# Patient Record
Sex: Female | Born: 1960 | Race: Black or African American | Hispanic: No | Marital: Married | Smoking: Never smoker
Health system: Southern US, Community
[De-identification: ages and names within clinical notes are randomized; demographics above are authoritative.]

## PROBLEM LIST (undated history)

## (undated) ENCOUNTER — Emergency Department (HOSPITAL_COMMUNITY)

## (undated) HISTORY — PX: ABDOMINAL HYSTERECTOMY: SHX81

---

## 1997-11-06 ENCOUNTER — Other Ambulatory Visit: Admission: RE | Admit: 1997-11-06 | Discharge: 1997-11-06 | Payer: Self-pay | Admitting: Gynecology

## 1998-09-20 ENCOUNTER — Ambulatory Visit (HOSPITAL_COMMUNITY): Admission: RE | Admit: 1998-09-20 | Discharge: 1998-09-20 | Payer: Self-pay | Admitting: Gynecology

## 1999-09-02 ENCOUNTER — Other Ambulatory Visit: Admission: RE | Admit: 1999-09-02 | Discharge: 1999-09-02 | Payer: Self-pay | Admitting: Gynecology

## 1999-10-06 ENCOUNTER — Other Ambulatory Visit: Admission: RE | Admit: 1999-10-06 | Discharge: 1999-10-06 | Payer: Self-pay | Admitting: Gynecology

## 1999-10-24 ENCOUNTER — Ambulatory Visit (HOSPITAL_COMMUNITY): Admission: RE | Admit: 1999-10-24 | Discharge: 1999-10-24 | Payer: Self-pay | Admitting: Gynecology

## 1999-11-14 ENCOUNTER — Ambulatory Visit (HOSPITAL_COMMUNITY): Admission: RE | Admit: 1999-11-14 | Discharge: 1999-11-14 | Payer: Self-pay | Admitting: Gynecology

## 2000-02-26 ENCOUNTER — Other Ambulatory Visit: Admission: RE | Admit: 2000-02-26 | Discharge: 2000-02-26 | Payer: Self-pay | Admitting: Gynecology

## 2000-08-17 ENCOUNTER — Other Ambulatory Visit: Admission: RE | Admit: 2000-08-17 | Discharge: 2000-08-17 | Payer: Self-pay | Admitting: Gynecology

## 2001-08-17 ENCOUNTER — Other Ambulatory Visit: Admission: RE | Admit: 2001-08-17 | Discharge: 2001-08-17 | Payer: Self-pay | Admitting: Obstetrics & Gynecology

## 2003-06-11 ENCOUNTER — Other Ambulatory Visit: Admission: RE | Admit: 2003-06-11 | Discharge: 2003-06-11 | Payer: Self-pay | Admitting: Gynecology

## 2004-08-12 ENCOUNTER — Inpatient Hospital Stay (HOSPITAL_COMMUNITY): Admission: RE | Admit: 2004-08-12 | Discharge: 2004-08-14 | Payer: Self-pay | Admitting: Gynecology

## 2010-11-10 ENCOUNTER — Other Ambulatory Visit: Payer: Self-pay | Admitting: Plastic Surgery

## 2010-11-11 ENCOUNTER — Other Ambulatory Visit (HOSPITAL_COMMUNITY): Payer: Self-pay | Admitting: Plastic Surgery

## 2010-11-11 DIAGNOSIS — S301XXA Contusion of abdominal wall, initial encounter: Secondary | ICD-10-CM

## 2010-11-19 ENCOUNTER — Ambulatory Visit (HOSPITAL_COMMUNITY)
Admission: RE | Admit: 2010-11-19 | Discharge: 2010-11-19 | Disposition: A | Discharge status: Home / Self Care | Payer: Managed Care, Other (non HMO) | Source: Ambulatory Visit | Attending: Plastic Surgery | Admitting: Plastic Surgery

## 2010-11-19 DIAGNOSIS — S301XXA Contusion of abdominal wall, initial encounter: Secondary | ICD-10-CM

## 2010-11-19 DIAGNOSIS — Y838 Other surgical procedures as the cause of abnormal reaction of the patient, or of later complication, without mention of misadventure at the time of the procedure: Secondary | ICD-10-CM | POA: Insufficient documentation

## 2010-11-19 DIAGNOSIS — IMO0002 Reserved for concepts with insufficient information to code with codable children: Secondary | ICD-10-CM | POA: Insufficient documentation

## 2010-11-19 DIAGNOSIS — Z01812 Encounter for preprocedural laboratory examination: Secondary | ICD-10-CM | POA: Insufficient documentation

## 2010-11-19 LAB — CBC
Platelets: 212 10*3/uL (ref 150–400)
RDW: 13.1 % (ref 11.5–15.5)
WBC: 4.2 10*3/uL (ref 4.0–10.5)

## 2010-11-19 LAB — PROTIME-INR: INR: 1.04 (ref 0.00–1.49)

## 2010-11-19 LAB — APTT: aPTT: 32 seconds (ref 24–37)

## 2010-11-20 ENCOUNTER — Other Ambulatory Visit: Payer: Self-pay | Admitting: Interventional Radiology

## 2010-11-20 DIAGNOSIS — IMO0002 Reserved for concepts with insufficient information to code with codable children: Secondary | ICD-10-CM

## 2010-11-26 ENCOUNTER — Other Ambulatory Visit: Payer: Self-pay | Admitting: Interventional Radiology

## 2010-11-26 ENCOUNTER — Ambulatory Visit
Admission: RE | Admit: 2010-11-26 | Discharge: 2010-11-26 | Disposition: A | Discharge status: Home / Self Care | Payer: Managed Care, Other (non HMO) | Source: Ambulatory Visit | Attending: Interventional Radiology | Admitting: Interventional Radiology

## 2010-11-26 DIAGNOSIS — IMO0002 Reserved for concepts with insufficient information to code with codable children: Secondary | ICD-10-CM

## 2010-12-02 ENCOUNTER — Inpatient Hospital Stay: Admission: RE | Admit: 2010-12-02 | Payer: Managed Care, Other (non HMO) | Source: Ambulatory Visit

## 2012-04-11 ENCOUNTER — Other Ambulatory Visit: Payer: Self-pay | Admitting: Gynecology

## 2015-01-28 ENCOUNTER — Ambulatory Visit (INDEPENDENT_AMBULATORY_CARE_PROVIDER_SITE_OTHER): Payer: Managed Care, Other (non HMO)

## 2015-01-28 ENCOUNTER — Ambulatory Visit (INDEPENDENT_AMBULATORY_CARE_PROVIDER_SITE_OTHER): Payer: Managed Care, Other (non HMO) | Admitting: Family Medicine

## 2015-01-28 VITALS — BP 130/83 | HR 114 | Temp 100.7°F | Resp 18 | Ht 68.0 in | Wt 201.8 lb

## 2015-01-28 DIAGNOSIS — J9801 Acute bronchospasm: Secondary | ICD-10-CM | POA: Diagnosis not present

## 2015-01-28 DIAGNOSIS — R05 Cough: Secondary | ICD-10-CM

## 2015-01-28 DIAGNOSIS — J988 Other specified respiratory disorders: Secondary | ICD-10-CM | POA: Diagnosis not present

## 2015-01-28 DIAGNOSIS — R059 Cough, unspecified: Secondary | ICD-10-CM

## 2015-01-28 DIAGNOSIS — R509 Fever, unspecified: Secondary | ICD-10-CM

## 2015-01-28 DIAGNOSIS — J22 Unspecified acute lower respiratory infection: Secondary | ICD-10-CM

## 2015-01-28 LAB — POCT CBC
GRANULOCYTE PERCENT: 61.8 % (ref 37–80)
HEMATOCRIT: 37.6 % — AB (ref 37.7–47.9)
HEMOGLOBIN: 12.1 g/dL — AB (ref 12.2–16.2)
Lymph, poc: 1.7 (ref 0.6–3.4)
MCH, POC: 26.3 pg — AB (ref 27–31.2)
MCHC: 32.2 g/dL (ref 31.8–35.4)
MCV: 81.8 fL (ref 80–97)
MID (cbc): 0.5 (ref 0–0.9)
MPV: 7.8 fL (ref 0–99.8)
PLATELET COUNT, POC: 205 10*3/uL (ref 142–424)
POC Granulocyte: 3.5 (ref 2–6.9)
POC LYMPH %: 29.7 % (ref 10–50)
POC MID %: 8.5 % (ref 0–12)
RBC: 4.6 M/uL (ref 4.04–5.48)
RDW, POC: 14.1 %
WBC: 5.7 10*3/uL (ref 4.6–10.2)

## 2015-01-28 MED ORDER — AZITHROMYCIN 250 MG PO TABS: ORAL_TABLET | ORAL | Status: AC

## 2015-01-28 MED ORDER — BENZONATATE 100 MG PO CAPS: 100.0000 mg | ORAL_CAPSULE | Freq: Three times a day (TID) | ORAL | Status: AC | PRN

## 2015-01-28 MED ORDER — IPRATROPIUM BROMIDE 0.03 % NA SOLN: 2.0000 | Freq: Two times a day (BID) | NASAL | Status: AC

## 2015-01-28 MED ORDER — ALBUTEROL SULFATE (2.5 MG/3ML) 0.083% IN NEBU
2.5000 mg | INHALATION_SOLUTION | Freq: Once | RESPIRATORY_TRACT | Status: AC
Start: 2015-01-28 — End: 2015-01-28
  Administered 2015-01-28: 2.5 mg via RESPIRATORY_TRACT

## 2015-01-28 NOTE — Patient Instructions (Signed)
1.  Continue Mucinex twice daily.  Sinusitis Sinusitis is redness, soreness, and inflammation of the paranasal sinuses. Paranasal sinuses are air pockets within the bones of your face (beneath the eyes, the middle of the forehead, or above the eyes). In healthy paranasal sinuses, mucus is able to drain out, and air is able to circulate through them by way of your nose. However, when your paranasal sinuses are inflamed, mucus and air can become trapped. This can allow bacteria and other germs to grow and cause infection. Sinusitis can develop quickly and last only a short time (acute) or continue over a long period (chronic). Sinusitis that lasts for more than 12 weeks is considered chronic.  CAUSES  Causes of sinusitis include:  Allergies.  Structural abnormalities, such as displacement of the cartilage that separates your nostrils (deviated septum), which can decrease the air flow through your nose and sinuses and affect sinus drainage.  Functional abnormalities, such as when the small hairs (cilia) that line your sinuses and help remove mucus do not work properly or are not present. SIGNS AND SYMPTOMS  Symptoms of acute and chronic sinusitis are the same. The primary symptoms are pain and pressure around the affected sinuses. Other symptoms include:  Upper toothache.  Earache.  Headache.  Bad breath.  Decreased sense of smell and taste.  A cough, which worsens when you are lying flat.  Fatigue.  Fever.  Thick drainage from your nose, which often is green and may contain pus (purulent).  Swelling and warmth over the affected sinuses. DIAGNOSIS  Your health care provider will perform a physical exam. During the exam, your health care provider may:  Look in your nose for signs of abnormal growths in your nostrils (nasal polyps).  Tap over the affected sinus to check for signs of infection.  View the inside of your sinuses (endoscopy) using an imaging device that has a light  attached (endoscope). If your health care provider suspects that you have chronic sinusitis, one or more of the following tests may be recommended:  Allergy tests.  Nasal culture. A sample of mucus is taken from your nose, sent to a lab, and screened for bacteria.  Nasal cytology. A sample of mucus is taken from your nose and examined by your health care provider to determine if your sinusitis is related to an allergy. TREATMENT  Most cases of acute sinusitis are related to a viral infection and will resolve on their own within 10 days. Sometimes medicines are prescribed to help relieve symptoms (pain medicine, decongestants, nasal steroid sprays, or saline sprays).  However, for sinusitis related to a bacterial infection, your health care provider will prescribe antibiotic medicines. These are medicines that will help kill the bacteria causing the infection.  Rarely, sinusitis is caused by a fungal infection. In theses cases, your health care provider will prescribe antifungal medicine. For some cases of chronic sinusitis, surgery is needed. Generally, these are cases in which sinusitis recurs more than 3 times per year, despite other treatments. HOME CARE INSTRUCTIONS   Drink plenty of water. Water helps thin the mucus so your sinuses can drain more easily.  Use a humidifier.  Inhale steam 3 to 4 times a day (for example, sit in the bathroom with the shower running).  Apply a warm, moist washcloth to your face 3 to 4 times a day, or as directed by your health care provider.  Use saline nasal sprays to help moisten and clean your sinuses.  Take medicines only as  directed by your health care provider.  If you were prescribed either an antibiotic or antifungal medicine, finish it all even if you start to feel better. SEEK IMMEDIATE MEDICAL CARE IF:  You have increasing pain or severe headaches.  You have nausea, vomiting, or drowsiness.  You have swelling around your face.  You  have vision problems.  You have a stiff neck.  You have difficulty breathing. MAKE SURE YOU:   Understand these instructions.  Will watch your condition.  Will get help right away if you are not doing well or get worse. Document Released: 06/22/2005 Document Revised: 11/06/2013 Document Reviewed: 07/07/2011 [facility] Patient Information 2015 [place], [place]. This information is not intended to replace advice given to you by your health care provider. Make sure you discuss any questions you have with your health care provider.

## 2015-01-28 NOTE — Progress Notes (Signed)
Subjective:    Patient ID: Holly Li, female    DOB: 1961/03/29, 54 y.o.   MRN: [id number]  01/28/2015  Cough; Chest Congestion; and Fever   HPI This 54 y.o. female presents for evaluation of acute illness four days ago.  +chills five days ago.  +cough four days ago.  Started taking Mucinex this morning.  Might have helped chest congestion.  +fever Tmax 101.9; no sweats. +HA.  No ear pain.  +ST; diffuse sore throat; no pain with swallowing. +nasal congestion.  +rhinorrhea.  Yellow thin mucous.  No sputum production.  +wheezing.  No asthma.  No tobacco.  +SOB; +excessive fatigue. 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    PCP: O-Buck; [facility]    Review of Systems  Constitutional: Positive for fever, chills and fatigue. Negative for diaphoresis.  HENT: Positive for congestion, postnasal drip, rhinorrhea, sore throat and voice change. Negative for ear pain, sinus pressure and trouble swallowing.   Respiratory: Positive for cough, shortness of breath and wheezing.   Gastrointestinal: Negative for nausea, vomiting, abdominal pain and diarrhea.  Skin: Negative for rash.  Neurological: Positive for headaches.    History reviewed. No pertinent past medical history. Past Surgical History  Procedure Laterality Date  . Abdominal hysterectomy     Allergies  Allergen Reactions  . Penicillins Hives    History   Social History  . Marital Status: Married    Spouse Name: N/A  . Number of Children: N/A  . Years of Education: N/A   Occupational History  . Not on file.   Social History Main Topics  . Smoking status: Never Smoker   . Smokeless tobacco: Not on file  . Alcohol Use: 0.0 oz/week    0 Standard drinks or equivalent per week  . Drug Use: No  . Sexual Activity: Not on file   Other Topics Concern  . Not on file   Social History Narrative  . No narrative on file       Objective:    BP 130/83 mmHg  Pulse 114  Temp(Src) 100.7 F (38.2 C) (Oral)  Resp 18  Ht  (1.727  m)  Wt 201 lb 12.8 oz (91.536 kg)  BMI 30.69 kg/m2  SpO2 97% Physical Exam  Constitutional: She is oriented to person, place, and time. She appears well-developed and well-nourished. No distress.  HENT:  Head: Normocephalic and atraumatic.  Right Ear: External ear normal.  Left Ear: External ear normal.  Nose: Mucosal edema and rhinorrhea present.  Mouth/Throat: Mucous membranes are normal. Posterior oropharyngeal erythema present. No oropharyngeal exudate or tonsillar abscesses.  B cerumen impaction  Eyes: Conjunctivae are normal. Pupils are equal, round, and reactive to light.  Neck: Normal range of motion. Neck supple.  Cardiovascular: Normal rate, regular rhythm and normal heart sounds.  Exam reveals no gallop and no friction rub.   No murmur heard. Pulmonary/Chest: Effort normal and breath sounds normal. She has no wheezes. She has no rales.  Lymphadenopathy:    She has cervical adenopathy.  Neurological: She is alert and oriented to person, place, and time.  Skin: Skin is warm and dry. No rash noted. She is not diaphoretic.  Psychiatric: She has a normal mood and affect. Her behavior is normal.  Nursing note and vitals reviewed.  Results for orders placed or performed in visit on 01/28/15  POCT CBC  Result Value Ref Range   WBC 5.7 4.6 - 10.2 K/uL   Lymph, poc 1.7 0.6 - 3.4  POC LYMPH PERCENT 29.7 10 - 50 %L   MID (cbc) 0.5 0 - 0.9   POC MID % 8.5 0 - 12 %M   POC Granulocyte 3.5 2 - 6.9   Granulocyte percent 61.8 37 - 80 %G   RBC 4.60 4.04 - 5.48 M/uL   Hemoglobin 12.1 (A) 12.2 - 16.2 g/dL   HCT, POC 16.1 (A) 09.6 - 47.9 %   MCV 81.8 80 - 97 fL   MCH, POC 26.3 (A) 27 - 31.2 pg   MCHC 32.2 31.8 - 35.4 g/dL   RDW, POC 04.5 %   Platelet Count, POC 205 142 - 424 K/uL   MPV 7.8 0 - 99.8 fL   [facility] reading (PRIMARY) by  Dr. Katrinka Blazing.  CXR:  NAD.   ALBUTEROL NEBULIZER ADMINISTERED.     Assessment & Plan:   1. Cough   2. Other specified fever   3. Bronchospasm   4.  Lower respiratory infection     -New. -Rx for Zithromax, Tessalon Perles, Atrovent nasal spray provided. -Continue Mucinex bid.   -RTC if no improvement or if acutely worsens. -s/p albuterol nebulizer in office.  Meds ordered this encounter  Medications  . guaiFENesin (MUCINEX) 600 MG 12 hr tablet    Sig: Take by mouth 2 (two) times daily.  Marland Kitchen albuterol (PROVENTIL) (2.5 MG/3ML) 0.083% nebulizer solution 2.5 mg    Sig:   . azithromycin (ZITHROMAX) 250 MG tablet    Sig: Two tablets daily x 1 day then one tablet daily x 4 days    Dispense:  6 tablet    Refill:  0  . ipratropium (ATROVENT) 0.03 % nasal spray    Sig: Place 2 sprays into the nose 2 (two) times daily.    Dispense:  30 mL    Refill:  0  . benzonatate (TESSALON) 100 MG capsule    Sig: Take 1-2 capsules (100-200 mg total) by mouth 3 (three) times daily as needed for cough.    Dispense:  40 capsule    Refill:  0    No Follow-up on file.    Aanika Defoor Paulita Fujita, M.D. [facility] [address] [place], [place]  [address] [phone] phone [phone] fax

## 2015-04-17 ENCOUNTER — Other Ambulatory Visit: Payer: Self-pay | Admitting: Obstetrics

## 2015-04-18 LAB — CYTOLOGY - PAP

## 2016-03-10 ENCOUNTER — Other Ambulatory Visit: Payer: Self-pay | Admitting: Plastic Surgery

## 2016-09-09 ENCOUNTER — Other Ambulatory Visit: Payer: Self-pay | Admitting: Neurosurgery

## 2016-09-09 DIAGNOSIS — M5412 Radiculopathy, cervical region: Secondary | ICD-10-CM

## 2016-09-19 ENCOUNTER — Ambulatory Visit
Admission: RE | Admit: 2016-09-19 | Discharge: 2016-09-19 | Disposition: A | Discharge status: Home / Self Care | Payer: 59 | Source: Ambulatory Visit | Attending: Neurosurgery | Admitting: Neurosurgery

## 2016-09-19 DIAGNOSIS — M5412 Radiculopathy, cervical region: Secondary | ICD-10-CM

## 2017-12-18 IMAGING — MR MR CERVICAL SPINE W/O CM
4 of 5 series · 22 of 48 positions shown · non-contrast
Comparison: Radiography 08/26/2016

CLINICAL DATA: Neck pain with right arm pain since [REDACTED] of this
year.

EXAM:
MRI CERVICAL SPINE WITHOUT CONTRAST
TECHNIQUE: Multiplanar, multisequence MR imaging of the cervical spine was
performed. No intravenous contrast was administered.

[Series 3: T2 post-contrast · sagittal · 3.5mm · 0.35mm/px · 7 of 13 slices shown]
[im 1/13]
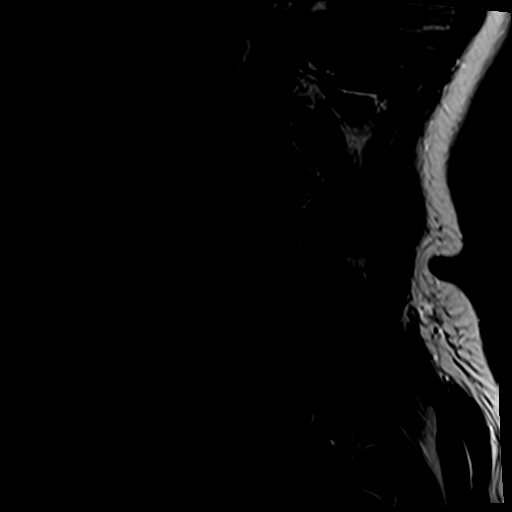
[im 3/13]
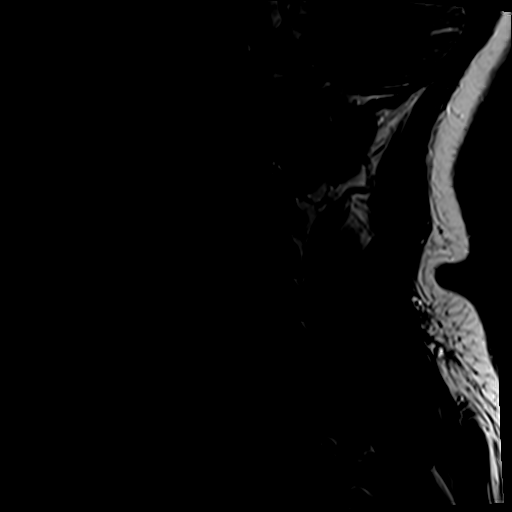
[im 5/13]
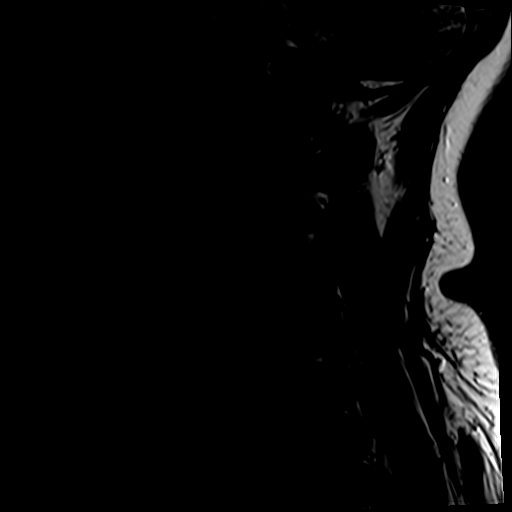
[im 7/13]
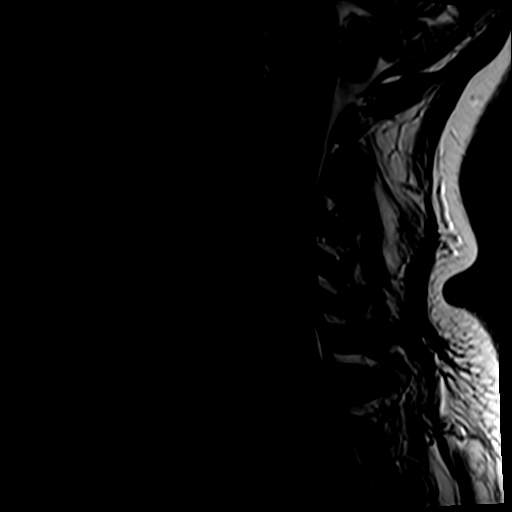
[im 9/13]
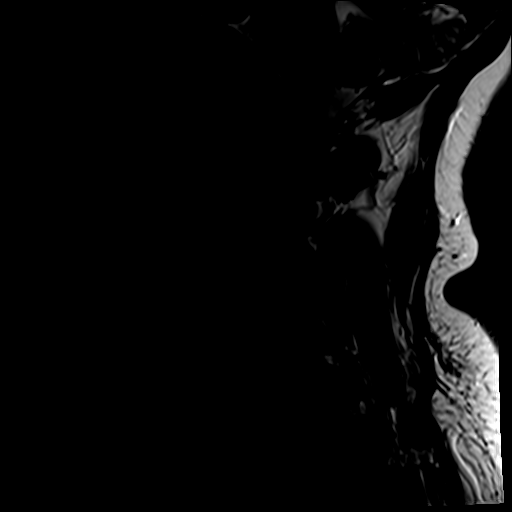
[im 11/13]
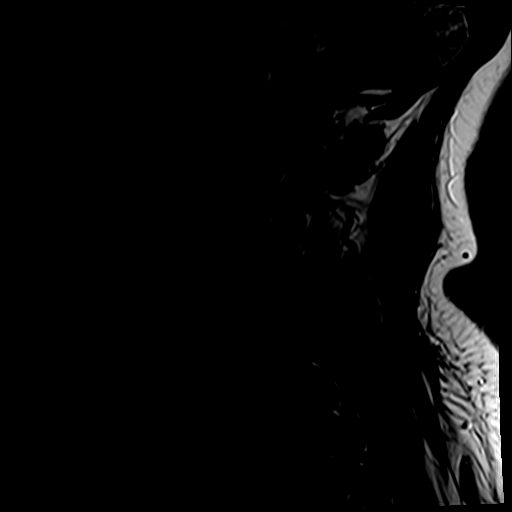
[im 13/13]
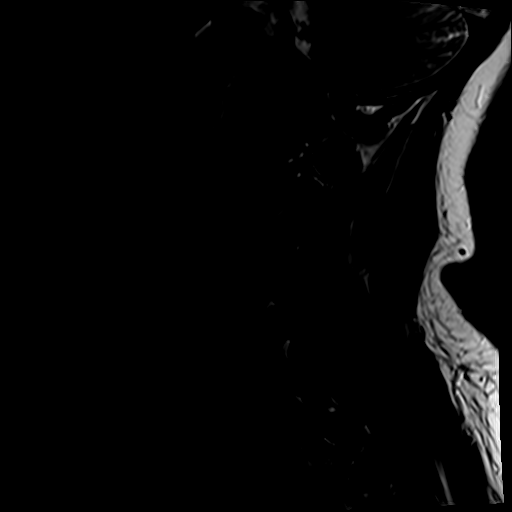

[Series 4: T1 · sagittal · 3.5mm · 0.35mm/px · 4 of 13 slices shown]
[im 1/13]
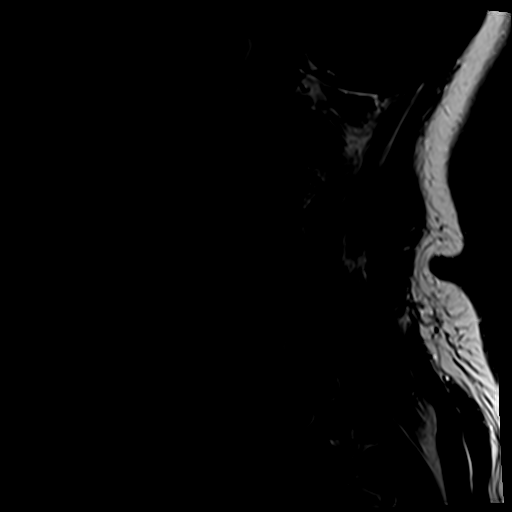
[im 3/13]
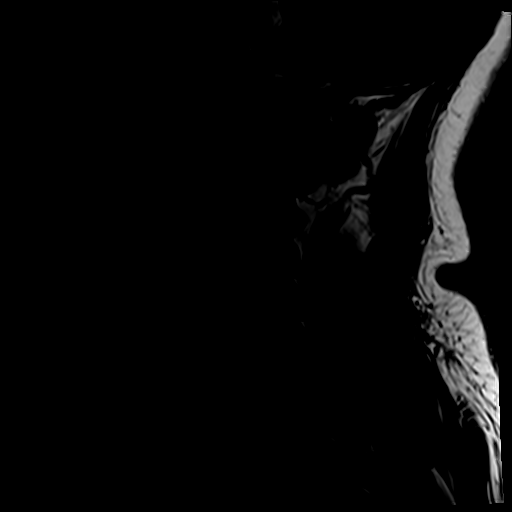
[im 7/13]
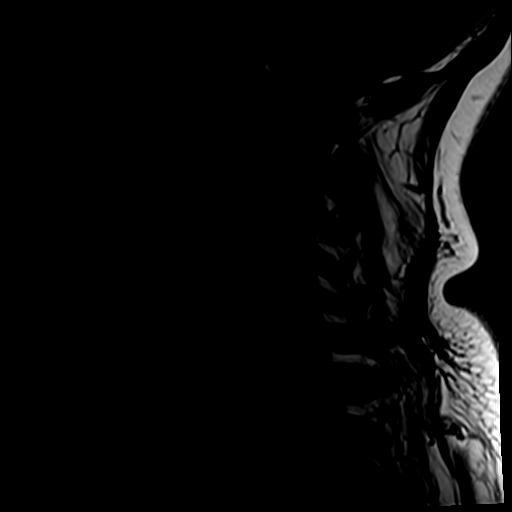
[im 11/13]
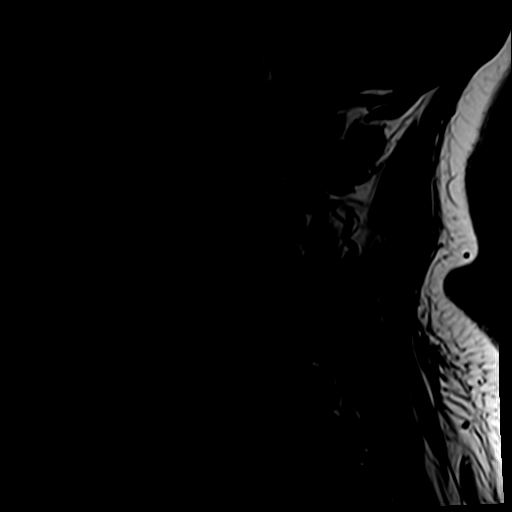

[Series 5: tir sag · sagittal · 3.5mm · 0.39mm/px · 3 of 13 slices shown]
[im 3/13]
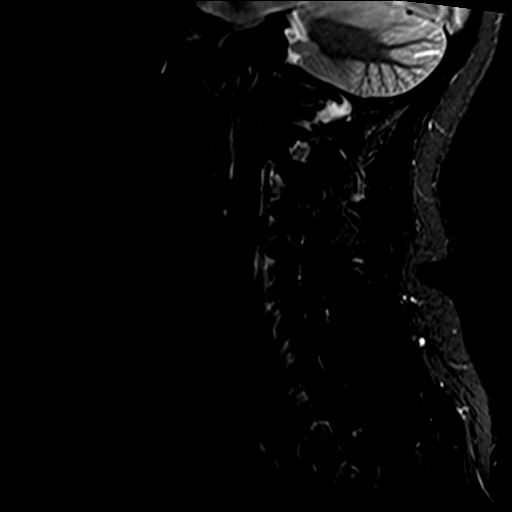
[im 8/13]
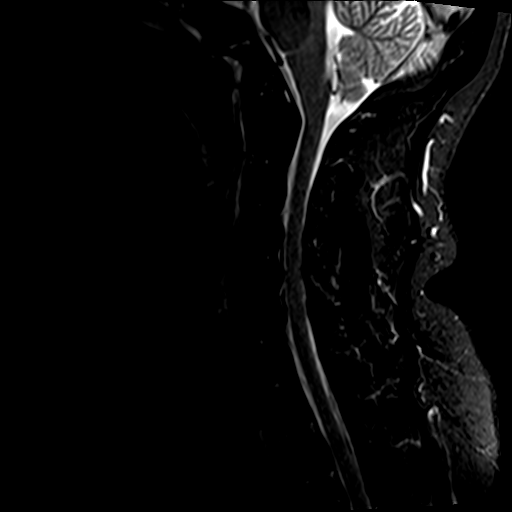
[im 13/13]
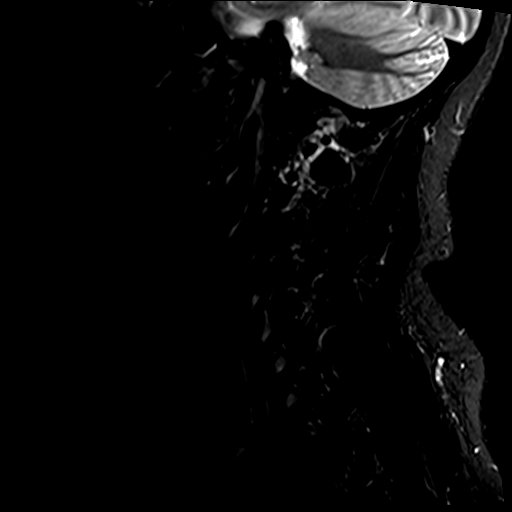

[Series 6: T2 · axial · 3.0mm · 0.39mm/px · z∈[-55,+40]mm · 8 of 29 slices shown]
[im 1/29]
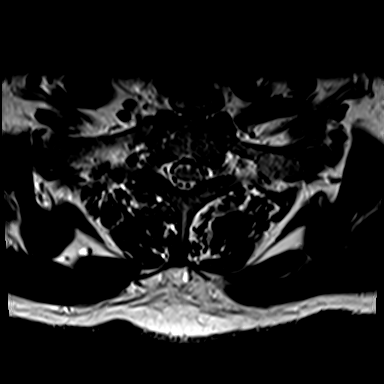
[im 5/29]
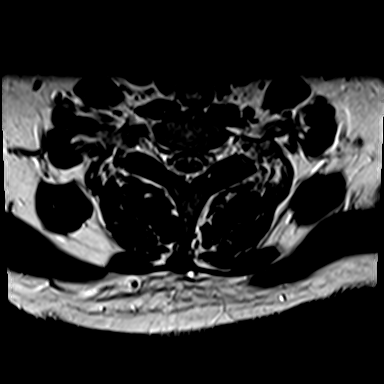
[im 9/29]
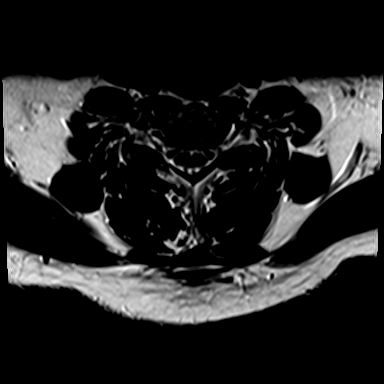
[im 13/29]
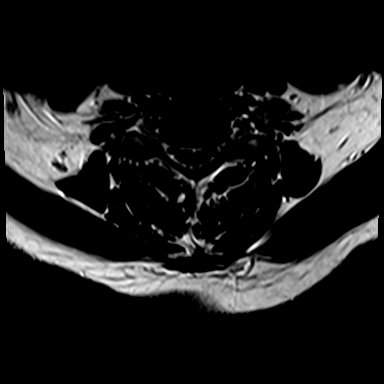
[im 16/29]
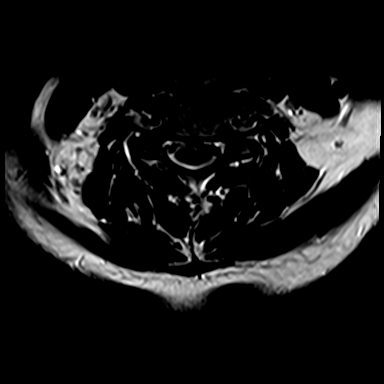
[im 20/29]
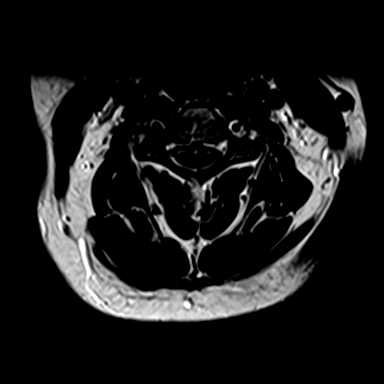
[im 24/29]
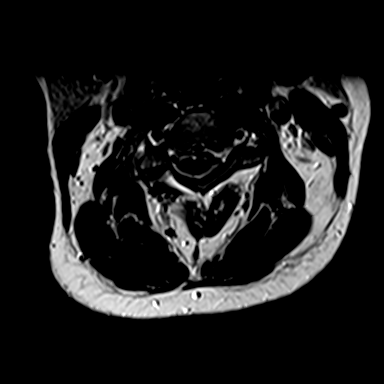
[im 29/29]
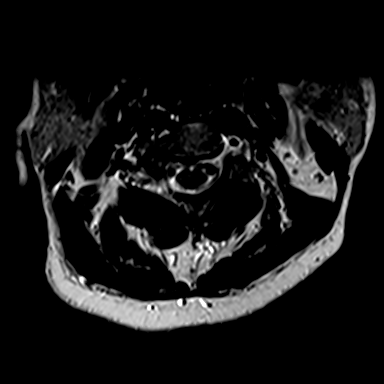

[22 of 48 positions shown; findings below may reference images not displayed]

FINDINGS: Alignment: Normal

Vertebrae: No fracture or primary bone lesion.

Cord: No cord compression or primary cord lesion.

Posterior Fossa, vertebral arteries, paraspinal tissues: Normal

Disc levels:

No abnormality at the foramen magnum, C1-2 or C2-3.

C3-4: Endplate osteophytes and bulging of the disc. Narrowing of the
ventral subarachnoid space but no compression of the cord. Foraminal
encroachment bilaterally that could affect either or both C4 nerve
roots.

C4-5: Spondylosis with endplate osteophytes and bulging of the disc.
Narrowing of the ventral subarachnoid space but no compression of
the cord. Foraminal narrowing on the right that could affect the
right C5 nerve root.

C5-6: Spondylosis with endplate osteophytes and bulging of the disc.
Narrowing of the ventral subarachnoid space but no compression of
the cord. Foraminal encroachment left more than right. Left C6 nerve
root could be affected.

C6-7:  Normal interspace.

C7-T1:  Normal interspace.
IMPRESSION: Spondylosis at C3-4, C4-5 and C5-6. No cord compression. Foraminal
narrowing could cause compression of either or both C4 nerve roots,
the right C5 nerve root, or the left C6 nerve root.

## 2022-06-03 ENCOUNTER — Other Ambulatory Visit (HOSPITAL_COMMUNITY): Payer: Self-pay

## 2022-06-05 ENCOUNTER — Other Ambulatory Visit (HOSPITAL_COMMUNITY): Payer: Self-pay

## 2022-06-05 MED ORDER — OZEMPIC (1 MG/DOSE) 4 MG/3ML ~~LOC~~ SOPN
1.0000 mg | PEN_INJECTOR | SUBCUTANEOUS | 3 refills | Status: AC
  Filled 2022-06-05: qty 3, 28d supply, fill #0
  Filled 2022-07-01: qty 3, 28d supply, fill #1
  Filled 2022-07-27: qty 3, 28d supply, fill #2

## 2022-06-08 ENCOUNTER — Other Ambulatory Visit (HOSPITAL_COMMUNITY): Payer: Self-pay

## 2022-06-24 ENCOUNTER — Other Ambulatory Visit (HOSPITAL_COMMUNITY): Payer: Self-pay

## 2022-07-07 ENCOUNTER — Other Ambulatory Visit (HOSPITAL_COMMUNITY): Payer: Self-pay

## 2022-07-27 ENCOUNTER — Other Ambulatory Visit (HOSPITAL_COMMUNITY): Payer: Self-pay

## 2022-08-24 ENCOUNTER — Other Ambulatory Visit (HOSPITAL_COMMUNITY): Payer: Self-pay

## 2022-08-24 MED ORDER — OZEMPIC (1 MG/DOSE) 4 MG/3ML ~~LOC~~ SOPN
1.0000 mg | PEN_INJECTOR | SUBCUTANEOUS | 3 refills | Status: AC
  Filled 2022-08-24: qty 3, 28d supply, fill #0
  Filled 2022-09-22: qty 3, 28d supply, fill #1
  Filled 2022-10-23: qty 3, 28d supply, fill #2
  Filled 2022-11-16: qty 3, 28d supply, fill #3

## 2022-09-25 ENCOUNTER — Other Ambulatory Visit (HOSPITAL_COMMUNITY): Payer: Self-pay

## 2022-11-16 ENCOUNTER — Other Ambulatory Visit (HOSPITAL_COMMUNITY): Payer: Self-pay

## 2022-11-19 ENCOUNTER — Other Ambulatory Visit (HOSPITAL_COMMUNITY): Payer: Self-pay

## 2022-11-19 MED ORDER — OZEMPIC (2 MG/DOSE) 8 MG/3ML ~~LOC~~ SOPN
2.0000 mg | PEN_INJECTOR | SUBCUTANEOUS | 0 refills | Status: AC
  Filled 2022-11-19: qty 3, 28d supply, fill #0
  Filled 2022-12-07 – 2022-12-22 (×3): qty 3, 28d supply, fill #1
  Filled 2023-04-19: qty 3, 28d supply, fill #2

## 2022-12-07 ENCOUNTER — Other Ambulatory Visit (HOSPITAL_COMMUNITY): Payer: Self-pay

## 2022-12-18 ENCOUNTER — Other Ambulatory Visit (HOSPITAL_COMMUNITY): Payer: Self-pay

## 2022-12-22 ENCOUNTER — Other Ambulatory Visit (HOSPITAL_COMMUNITY): Payer: Self-pay

## 2023-01-14 ENCOUNTER — Other Ambulatory Visit (HOSPITAL_COMMUNITY): Payer: Self-pay

## 2023-01-14 MED ORDER — OZEMPIC (2 MG/DOSE) 8 MG/3ML ~~LOC~~ SOPN
2.0000 mg | PEN_INJECTOR | SUBCUTANEOUS | 0 refills | Status: DC
Start: 2023-01-14 — End: 2023-04-22
  Filled 2023-01-14: qty 3, 28d supply, fill #0
  Filled 2023-02-22: qty 3, 28d supply, fill #1
  Filled 2023-03-22: qty 3, 28d supply, fill #2

## 2023-01-15 ENCOUNTER — Other Ambulatory Visit (HOSPITAL_COMMUNITY): Payer: Self-pay

## 2023-03-22 ENCOUNTER — Other Ambulatory Visit (HOSPITAL_COMMUNITY): Payer: Self-pay

## 2023-04-17 ENCOUNTER — Other Ambulatory Visit (HOSPITAL_COMMUNITY): Payer: Self-pay

## 2023-04-19 ENCOUNTER — Other Ambulatory Visit (HOSPITAL_COMMUNITY): Payer: Self-pay

## 2023-04-22 ENCOUNTER — Other Ambulatory Visit (HOSPITAL_COMMUNITY): Payer: Self-pay

## 2023-04-22 MED ORDER — OZEMPIC (2 MG/DOSE) 8 MG/3ML ~~LOC~~ SOPN
2.0000 mg | PEN_INJECTOR | SUBCUTANEOUS | 1 refills | Status: AC
Start: 2023-04-22 — End: ?
  Filled 2023-04-22: qty 3, 28d supply, fill #0
